# Patient Record
Sex: Male | Born: 1955 | Race: White | Hispanic: No | Marital: Single | Smoking: Current every day smoker
Health system: Southern US, Community
[De-identification: ages and names within clinical notes are randomized; demographics above are authoritative.]

## PROBLEM LIST (undated history)

## (undated) DIAGNOSIS — K9041 Non-celiac gluten sensitivity: Secondary | ICD-10-CM

## (undated) DIAGNOSIS — C801 Malignant (primary) neoplasm, unspecified: Secondary | ICD-10-CM

## (undated) DIAGNOSIS — Z789 Other specified health status: Secondary | ICD-10-CM

## (undated) HISTORY — PX: FOREIGN BODY REMOVAL: SHX962

---

## 2013-05-27 HISTORY — PX: MOHS SURGERY: SHX181

## 2013-06-17 ENCOUNTER — Encounter (HOSPITAL_BASED_OUTPATIENT_CLINIC_OR_DEPARTMENT_OTHER): Payer: Self-pay | Admitting: *Deleted

## 2013-06-17 NOTE — Progress Notes (Signed)
Pt has never had general an-only mac-no cardiac or resp problems-no labs needed

## 2013-06-23 ENCOUNTER — Other Ambulatory Visit: Payer: Self-pay | Admitting: Plastic Surgery

## 2013-06-23 ENCOUNTER — Encounter (HOSPITAL_BASED_OUTPATIENT_CLINIC_OR_DEPARTMENT_OTHER): Payer: Self-pay | Admitting: Anesthesiology

## 2013-06-23 ENCOUNTER — Ambulatory Visit (HOSPITAL_BASED_OUTPATIENT_CLINIC_OR_DEPARTMENT_OTHER): Payer: BC Managed Care – PPO | Admitting: Anesthesiology

## 2013-06-23 ENCOUNTER — Encounter (HOSPITAL_BASED_OUTPATIENT_CLINIC_OR_DEPARTMENT_OTHER): Payer: Self-pay | Admitting: *Deleted

## 2013-06-23 ENCOUNTER — Ambulatory Visit (HOSPITAL_BASED_OUTPATIENT_CLINIC_OR_DEPARTMENT_OTHER)
Admission: RE | Admit: 2013-06-23 | Discharge: 2013-06-23 | Disposition: A | Discharge status: Home / Self Care | Payer: BC Managed Care – PPO | Source: Ambulatory Visit | Attending: Plastic Surgery | Admitting: Plastic Surgery

## 2013-06-23 ENCOUNTER — Encounter (HOSPITAL_BASED_OUTPATIENT_CLINIC_OR_DEPARTMENT_OTHER)
Admission: RE | Disposition: A | Discharge status: Home / Self Care | Payer: Self-pay | Source: Ambulatory Visit | Attending: Plastic Surgery

## 2013-06-23 DIAGNOSIS — C44211 Basal cell carcinoma of skin of unspecified ear and external auricular canal: Secondary | ICD-10-CM | POA: Insufficient documentation

## 2013-06-23 DIAGNOSIS — Z538 Procedure and treatment not carried out for other reasons: Secondary | ICD-10-CM | POA: Insufficient documentation

## 2013-06-23 DIAGNOSIS — H61111 Acquired deformity of pinna, right ear: Secondary | ICD-10-CM

## 2013-06-23 DIAGNOSIS — F17200 Nicotine dependence, unspecified, uncomplicated: Secondary | ICD-10-CM | POA: Insufficient documentation

## 2013-06-23 HISTORY — DX: Non-celiac gluten sensitivity: K90.41

## 2013-06-23 LAB — POCT HEMOGLOBIN-HEMACUE: Hemoglobin: 12.7 g/dL — ABNORMAL LOW (ref 13.0–17.0)

## 2013-06-23 SURGERY — CANCELLED PROCEDURE
Anesthesia: General

## 2013-06-23 MED ORDER — LACTATED RINGERS IV SOLN
INTRAVENOUS | Status: DC
  Administered 2013-06-23: 12:00:00 via INTRAVENOUS

## 2013-06-23 MED ORDER — CIPROFLOXACIN IN D5W 400 MG/200ML IV SOLN: 400.0000 mg | INTRAVENOUS | Status: DC

## 2013-06-23 MED ORDER — MIDAZOLAM HCL 2 MG/2ML IJ SOLN: 1.0000 mg | INTRAMUSCULAR | Status: DC | PRN

## 2013-06-23 MED ORDER — FENTANYL CITRATE 0.05 MG/ML IJ SOLN: 50.0000 ug | INTRAMUSCULAR | Status: DC | PRN

## 2013-06-23 SURGICAL SUPPLY — 58 items
ADH SKN CLS APL DERMABOND .7 (GAUZE/BANDAGES/DRESSINGS)
BALL CTTN LRG ABS STRL LF (GAUZE/BANDAGES/DRESSINGS)
BANDAGE CONFORM 2  STR LF (GAUZE/BANDAGES/DRESSINGS) IMPLANT
BLADE HEX COATED 2.75 (ELECTRODE) IMPLANT
BLADE SURG 15 STRL LF DISP TIS (BLADE) ×1 IMPLANT
BLADE SURG 15 STRL SS (BLADE)
BLADE SURG ROTATE 9660 (MISCELLANEOUS) IMPLANT
BNDG CMPR MD 5X2 ELC HKLP STRL (GAUZE/BANDAGES/DRESSINGS)
BNDG ELASTIC 2 VLCR STRL LF (GAUZE/BANDAGES/DRESSINGS) IMPLANT
CANISTER SUCTION 1200CC (MISCELLANEOUS) IMPLANT
CLOTH BEACON ORANGE TIMEOUT ST (SAFETY) ×1 IMPLANT
CORDS BIPOLAR (ELECTRODE) IMPLANT
COTTONBALL LRG STERILE PKG (GAUZE/BANDAGES/DRESSINGS) IMPLANT
COVER MAYO STAND STRL (DRAPES) ×1 IMPLANT
COVER TABLE BACK 60X90 (DRAPES) ×1 IMPLANT
DERMABOND ADVANCED (GAUZE/BANDAGES/DRESSINGS)
DERMABOND ADVANCED .7 DNX12 (GAUZE/BANDAGES/DRESSINGS) IMPLANT
DRAPE U-SHAPE 76X120 STRL (DRAPES) IMPLANT
ELECT NDL BLADE 2-5/6 (NEEDLE) ×1 IMPLANT
ELECT NEEDLE BLADE 2-5/6 (NEEDLE) IMPLANT
ELECT REM PT RETURN 9FT ADLT (ELECTROSURGICAL)
ELECT REM PT RETURN 9FT PED (ELECTROSURGICAL)
ELECTRODE REM PT RETRN 9FT PED (ELECTROSURGICAL) IMPLANT
ELECTRODE REM PT RTRN 9FT ADLT (ELECTROSURGICAL) IMPLANT
GAUZE SPONGE 4X4 12PLY STRL LF (GAUZE/BANDAGES/DRESSINGS) IMPLANT
GAUZE XEROFORM 1X8 LF (GAUZE/BANDAGES/DRESSINGS) IMPLANT
GAUZE XEROFORM 5X9 LF (GAUZE/BANDAGES/DRESSINGS) IMPLANT
GLOVE BIO SURGEON STRL SZ 6.5 (GLOVE) ×1 IMPLANT
GLOVE SURG SS PI 7.0 STRL IVOR (GLOVE) IMPLANT
GOWN PREVENTION PLUS XLARGE (GOWN DISPOSABLE) ×2 IMPLANT
NDL HYPO 30GX1 BEV (NEEDLE) IMPLANT
NEEDLE 27GAX1X1/2 (NEEDLE) ×1 IMPLANT
NEEDLE HYPO 30GX1 BEV (NEEDLE) IMPLANT
NS IRRIG 1000ML POUR BTL (IV SOLUTION) IMPLANT
PACK BASIN DAY SURGERY FS (CUSTOM PROCEDURE TRAY) ×1 IMPLANT
PENCIL BUTTON HOLSTER BLD 10FT (ELECTRODE) IMPLANT
SHEET MEDIUM DRAPE 40X70 STRL (DRAPES) IMPLANT
SPONGE GAUZE 2X2 8PLY STRL LF (GAUZE/BANDAGES/DRESSINGS) IMPLANT
SPONGE LAP 18X18 X RAY DECT (DISPOSABLE) IMPLANT
SUCTION FRAZIER TIP 10 FR DISP (SUCTIONS) IMPLANT
SUT CHROMIC 4 0 P 3 18 (SUTURE) IMPLANT
SUT ETHILON 4 0 PS 2 18 (SUTURE) IMPLANT
SUT ETHILON 5 0 P 3 18 (SUTURE)
SUT MNCRL 6-0 UNDY P1 1X18 (SUTURE) ×1 IMPLANT
SUT MNCRL AB 4-0 PS2 18 (SUTURE) IMPLANT
SUT MON AB 5-0 P3 18 (SUTURE) IMPLANT
SUT MONOCRYL 6-0 P1 1X18 (SUTURE)
SUT NYLON ETHILON 5-0 P-3 1X18 (SUTURE) IMPLANT
SUT PLAIN 5 0 P 3 18 (SUTURE) IMPLANT
SUT PROLENE 5 0 P 3 (SUTURE) IMPLANT
SUT PROLENE 6 0 P 1 18 (SUTURE) IMPLANT
SUT VIC AB 5-0 P-3 18X BRD (SUTURE) IMPLANT
SUT VIC AB 5-0 P3 18 (SUTURE)
SUT VICRYL 4-0 PS2 18IN ABS (SUTURE) IMPLANT
SYR BULB 3OZ (MISCELLANEOUS) IMPLANT
SYR CONTROL 10ML LL (SYRINGE) ×1 IMPLANT
TRAY DSU PREP LF (CUSTOM PROCEDURE TRAY) ×1 IMPLANT
TUBE CONNECTING 20X1/4 (TUBING) IMPLANT

## 2013-06-23 NOTE — Anesthesia Preprocedure Evaluation (Signed)
Anesthesia Evaluation  Patient identified by MRN, date of birth, ID band Patient awake    Reviewed: Allergy & Precautions, H&P , NPO status , Patient's Chart, lab work & pertinent test results  Airway Mallampati: I TM Distance: >3 FB Neck ROM: Full    Dental  (+) Teeth Intact and Dental Advisory Given   Pulmonary  breath sounds clear to auscultation        Cardiovascular Rhythm:Regular Rate:Normal     Neuro/Psych    GI/Hepatic   Endo/Other    Renal/GU      Musculoskeletal   Abdominal   Peds  Hematology   Anesthesia Other Findings   Reproductive/Obstetrics                           Anesthesia Physical Anesthesia Plan  ASA: I  Anesthesia Plan: General   Post-op Pain Management:    Induction: Intravenous  Airway Management Planned: LMA  Additional Equipment:   Intra-op Plan:   Post-operative Plan: Extubation in OR  Informed Consent: I have reviewed the patients History and Physical, chart, labs and discussed the procedure including the risks, benefits and alternatives for the proposed anesthesia with the patient or authorized representative who has indicated his/her understanding and acceptance.   Dental advisory given  Plan Discussed with: CRNA and Anesthesiologist  Anesthesia Plan Comments:         Anesthesia Quick Evaluation

## 2013-06-23 NOTE — H&P (Signed)
This document contains confidential information from a [facility] medical record system and may be unauthenticated. Release may be made only with a valid authorization or in accordance with applicable policies of Medical Center or its affiliates. This document must be maintained in a secure manner or discarded/destroyed as required by Medical Center policy or by a confidential means such as shredding.     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 male.  HPI The patient is a 57 yrs old male here for evaluation of a right ear defect. He noticed a growing lesion on the right ear which was biopsied and found to be a basal cell carcinoma. He had the excision done last week via mohs with negative margins obtained.  He is otherwise in good health and does not have any other issues.  He is a half pack a day smoker. The defect involved the entire concha and a portion of the middle of the antihelix with a cartilage gone as well. He has a normal left ear.  The following portions of the patient's history were reviewed and updated as appropriate: allergies, current medications, past family history, past medical history, past social history, past surgical history and problem list.  Review of Systems  Constitutional: Negative.   HENT: Negative.   Eyes: Negative.   Respiratory: Negative.   Cardiovascular: Negative.   Gastrointestinal: Negative.   Endocrine: Negative.   Genitourinary: Negative.   Hematological: Negative.   Psychiatric/Behavioral: Negative.       Objective:    Physical Exam  Constitutional: He appears well-developed and well-nourished.  HENT:   Head: Normocephalic.  Left Ear: External ear normal.  Ears: Eyes: Conjunctivae and EOM are normal. Pupils are equal, round, and reactive to light.  Cardiovascular: Normal rate.   Pulmonary/Chest: Effort normal.  Neurological: He is alert.  Skin: Skin is warm.  Psychiatric: He has a normal mood and affect. His behavior is normal. Judgment and thought content normal.      Assessment:    1.  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

## 2013-06-23 NOTE — Progress Notes (Signed)
All valuables returned to patient

## 2013-06-23 NOTE — H&P (View-Only) (Signed)
This document contains confidential information from a [facility] medical record system and may be unauthenticated. Release may be made only with a valid authorization or in accordance with applicable policies of Medical Center or its affiliates. This document must be maintained in a secure manner or discarded/destroyed as required by Medical Center policy or by a confidential means such as shredding.     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 male.  HPI The patient is a 57 yrs old male here for evaluation of a right ear defect. He noticed a growing lesion on the right ear which was biopsied and found to be a basal cell carcinoma. He had the excision done last week via mohs with negative margins obtained.  He is otherwise in good health and does not have any other issues.  He is a half pack a day smoker. The defect involved the entire concha and a portion of the middle of the antihelix with a cartilage gone as well. He has a normal left ear.  The following portions of the patient's history were reviewed and updated as appropriate: allergies, current medications, past family history, past medical history, past social history, past surgical history and problem list.  Review of Systems  Constitutional: Negative.   HENT: Negative.   Eyes: Negative.   Respiratory: Negative.   Cardiovascular: Negative.   Gastrointestinal: Negative.   Endocrine: Negative.   Genitourinary: Negative.   Hematological: Negative.   Psychiatric/Behavioral: Negative.       Objective:    Physical Exam  Constitutional: He appears well-developed and well-nourished.  HENT:   Head: Normocephalic.  Left Ear: External ear normal.  Ears: Eyes: Conjunctivae and EOM are normal. Pupils are equal, round, and reactive to light.  Cardiovascular: Normal rate.   Pulmonary/Chest: Effort normal.  Neurological: He is alert.  Skin: Skin is warm.  Psychiatric: He has a normal mood and affect. His behavior is normal. Judgment and thought content normal.      Assessment:    1.  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          

## 2013-06-23 NOTE — Interval H&P Note (Signed)
History and Physical Interval Note:  06/23/2013 12:45 PM  Caleb Durham  has presented today for surgery, with the diagnosis of BASAL CELL CARCINOMA OF AURICLE OF RIGHT EAR  The various methods of treatment have been discussed with the patient and family. After consideration of risks, benefits and other options for treatment, the patient has consented to  Procedure(s) with comments: RIGHT EAR FULL THICKNESS SKIN GRAFT WITH POSSIBLE CARTLAGE GRAFT (Right) - case cancelled due to patient ear healing per Dr. Rhona Leavens as a surgical intervention .  The patient's history has been reviewed, patient examined, no change in status, stable for surgery.  I have reviewed the patient's chart and labs.  Questions were answered to the patient's satisfaction.    The surgery will be postponed due to changes and partial healing.  SANGER,Judithe Keetch

## 2013-06-23 NOTE — Progress Notes (Signed)
Case cancelled by Dr Kelly Splinter. Patient to go to her office today. He is aware.

## 2013-11-10 ENCOUNTER — Encounter (HOSPITAL_BASED_OUTPATIENT_CLINIC_OR_DEPARTMENT_OTHER): Payer: Self-pay | Admitting: *Deleted

## 2013-11-10 NOTE — Progress Notes (Signed)
No labs needed

## 2013-11-11 ENCOUNTER — Encounter (HOSPITAL_BASED_OUTPATIENT_CLINIC_OR_DEPARTMENT_OTHER)
Admission: RE | Disposition: A | Discharge status: Home / Self Care | Payer: Self-pay | Source: Ambulatory Visit | Attending: Plastic Surgery

## 2013-11-11 ENCOUNTER — Other Ambulatory Visit: Payer: Self-pay | Admitting: Plastic Surgery

## 2013-11-11 ENCOUNTER — Encounter (HOSPITAL_BASED_OUTPATIENT_CLINIC_OR_DEPARTMENT_OTHER): Payer: Self-pay | Admitting: Plastic Surgery

## 2013-11-11 ENCOUNTER — Ambulatory Visit (HOSPITAL_BASED_OUTPATIENT_CLINIC_OR_DEPARTMENT_OTHER): Payer: BC Managed Care – PPO | Admitting: Anesthesiology

## 2013-11-11 ENCOUNTER — Ambulatory Visit (HOSPITAL_BASED_OUTPATIENT_CLINIC_OR_DEPARTMENT_OTHER)
Admission: RE | Admit: 2013-11-11 | Discharge: 2013-11-11 | Disposition: A | Discharge status: Home / Self Care | Payer: BC Managed Care – PPO | Source: Ambulatory Visit | Attending: Plastic Surgery | Admitting: Plastic Surgery

## 2013-11-11 ENCOUNTER — Encounter (HOSPITAL_BASED_OUTPATIENT_CLINIC_OR_DEPARTMENT_OTHER): Payer: BC Managed Care – PPO | Admitting: Anesthesiology

## 2013-11-11 DIAGNOSIS — H61111 Acquired deformity of pinna, right ear: Secondary | ICD-10-CM

## 2013-11-11 DIAGNOSIS — Z85820 Personal history of malignant melanoma of skin: Secondary | ICD-10-CM | POA: Insufficient documentation

## 2013-11-11 DIAGNOSIS — Z428 Encounter for other plastic and reconstructive surgery following medical procedure or healed injury: Secondary | ICD-10-CM | POA: Insufficient documentation

## 2013-11-11 HISTORY — PX: OTOPLASATY: SHX1485

## 2013-11-11 HISTORY — DX: Other specified health status: Z78.9

## 2013-11-11 SURGERY — RECONSTRUCTION, EAR
Anesthesia: General | Site: Ear | Laterality: Right

## 2013-11-11 MED ORDER — LIDOCAINE HCL (CARDIAC) 20 MG/ML IV SOLN
INTRAVENOUS | Status: DC | PRN
  Administered 2013-11-11: 75 mg via INTRAVENOUS

## 2013-11-11 MED ORDER — PROPOFOL 10 MG/ML IV BOLUS
INTRAVENOUS | Status: DC | PRN
  Administered 2013-11-11: 300 mg via INTRAVENOUS

## 2013-11-11 MED ORDER — SUCCINYLCHOLINE CHLORIDE 20 MG/ML IJ SOLN
INTRAMUSCULAR | Status: AC
  Filled 2013-11-11: qty 1

## 2013-11-11 MED ORDER — ONDANSETRON HCL 4 MG/2ML IJ SOLN
INTRAMUSCULAR | Status: DC | PRN
  Administered 2013-11-11: 4 mg via INTRAVENOUS

## 2013-11-11 MED ORDER — FENTANYL CITRATE 0.05 MG/ML IJ SOLN: 50.0000 ug | INTRAMUSCULAR | Status: DC | PRN

## 2013-11-11 MED ORDER — OXYCODONE HCL 5 MG/5ML PO SOLN: 5.0000 mg | Freq: Once | ORAL | Status: DC | PRN

## 2013-11-11 MED ORDER — MIDAZOLAM HCL 2 MG/2ML IJ SOLN
INTRAMUSCULAR | Status: AC
  Filled 2013-11-11: qty 2

## 2013-11-11 MED ORDER — OXYCODONE HCL 5 MG PO TABS: 5.0000 mg | ORAL_TABLET | Freq: Once | ORAL | Status: DC | PRN

## 2013-11-11 MED ORDER — HYDROMORPHONE HCL PF 1 MG/ML IJ SOLN: 0.2500 mg | INTRAMUSCULAR | Status: DC | PRN

## 2013-11-11 MED ORDER — CIPROFLOXACIN IN D5W 400 MG/200ML IV SOLN
400.0000 mg | INTRAVENOUS | Status: AC
  Administered 2013-11-11 (×2): 400 mg via INTRAVENOUS

## 2013-11-11 MED ORDER — DEXAMETHASONE SODIUM PHOSPHATE 4 MG/ML IJ SOLN
INTRAMUSCULAR | Status: DC | PRN
  Administered 2013-11-11: 10 mg via INTRAVENOUS

## 2013-11-11 MED ORDER — MIDAZOLAM HCL 5 MG/5ML IJ SOLN
INTRAMUSCULAR | Status: DC | PRN
  Administered 2013-11-11: 2 mg via INTRAVENOUS

## 2013-11-11 MED ORDER — LIDOCAINE-EPINEPHRINE 1 %-1:100000 IJ SOLN
INTRAMUSCULAR | Status: DC | PRN
  Administered 2013-11-11: 1 mL

## 2013-11-11 MED ORDER — LACTATED RINGERS IV SOLN
INTRAVENOUS | Status: DC
  Administered 2013-11-11 (×2): via INTRAVENOUS

## 2013-11-11 MED ORDER — SUCCINYLCHOLINE CHLORIDE 20 MG/ML IJ SOLN
INTRAMUSCULAR | Status: DC | PRN
  Administered 2013-11-11: 100 mg via INTRAVENOUS

## 2013-11-11 MED ORDER — METOCLOPRAMIDE HCL 5 MG/ML IJ SOLN: 10.0000 mg | Freq: Once | INTRAMUSCULAR | Status: DC | PRN

## 2013-11-11 MED ORDER — CIPROFLOXACIN IN D5W 400 MG/200ML IV SOLN
INTRAVENOUS | Status: AC
  Filled 2013-11-11: qty 200

## 2013-11-11 MED ORDER — FENTANYL CITRATE 0.05 MG/ML IJ SOLN
INTRAMUSCULAR | Status: AC
  Filled 2013-11-11: qty 6

## 2013-11-11 MED ORDER — EPHEDRINE SULFATE 50 MG/ML IJ SOLN
INTRAMUSCULAR | Status: DC | PRN
  Administered 2013-11-11: 15 mg via INTRAVENOUS
  Administered 2013-11-11: 10 mg via INTRAVENOUS

## 2013-11-11 MED ORDER — FENTANYL CITRATE 0.05 MG/ML IJ SOLN
INTRAMUSCULAR | Status: DC | PRN
  Administered 2013-11-11: 25 ug via INTRAVENOUS
  Administered 2013-11-11 (×3): 50 ug via INTRAVENOUS

## 2013-11-11 MED ORDER — MIDAZOLAM HCL 2 MG/2ML IJ SOLN: 1.0000 mg | INTRAMUSCULAR | Status: DC | PRN

## 2013-11-11 SURGICAL SUPPLY — 40 items
ADH SKN CLS APL DERMABOND .7 (GAUZE/BANDAGES/DRESSINGS)
BLADE SURG 15 STRL LF DISP TIS (BLADE) ×1 IMPLANT
BLADE SURG 15 STRL SS (BLADE) ×2
CANISTER SUCT 1200ML W/VALVE (MISCELLANEOUS) ×2 IMPLANT
COVER MAYO STAND STRL (DRAPES) ×2 IMPLANT
COVER TABLE BACK 60X90 (DRAPES) ×2 IMPLANT
DECANTER SPIKE VIAL GLASS SM (MISCELLANEOUS) IMPLANT
DERMABOND ADVANCED (GAUZE/BANDAGES/DRESSINGS)
DERMABOND ADVANCED .7 DNX12 (GAUZE/BANDAGES/DRESSINGS) IMPLANT
DRAPE U-SHAPE 76X120 STRL (DRAPES) ×2 IMPLANT
ELECT COATED BLADE 2.86 ST (ELECTRODE) IMPLANT
ELECT NDL BLADE 2-5/6 (NEEDLE) ×1 IMPLANT
ELECT NEEDLE BLADE 2-5/6 (NEEDLE) ×2 IMPLANT
ELECT REM PT RETURN 9FT ADLT (ELECTROSURGICAL) ×2
ELECTRODE REM PT RTRN 9FT ADLT (ELECTROSURGICAL) ×1 IMPLANT
GAUZE XEROFORM 1X8 LF (GAUZE/BANDAGES/DRESSINGS) IMPLANT
GAUZE XEROFORM 5X9 LF (GAUZE/BANDAGES/DRESSINGS) IMPLANT
GLOVE BIO SURGEON STRL SZ 6.5 (GLOVE) ×4 IMPLANT
GLOVE BIOGEL PI IND STRL 6.5 (GLOVE) IMPLANT
GLOVE BIOGEL PI INDICATOR 6.5 (GLOVE) ×1
GLOVE ECLIPSE 6.5 STRL STRAW (GLOVE) ×1 IMPLANT
GOWN PREVENTION PLUS XLARGE (GOWN DISPOSABLE) ×4 IMPLANT
NEEDLE 27GAX1X1/2 (NEEDLE) ×2 IMPLANT
PACK BASIN DAY SURGERY FS (CUSTOM PROCEDURE TRAY) ×2 IMPLANT
PENCIL BUTTON HOLSTER BLD 10FT (ELECTRODE) ×2 IMPLANT
SPONGE GAUZE 4X4 12PLY STER LF (GAUZE/BANDAGES/DRESSINGS) IMPLANT
STRIP CLOSURE SKIN 1/2X4 (GAUZE/BANDAGES/DRESSINGS) IMPLANT
SUCTION FRAZIER TIP 10 FR DISP (SUCTIONS) ×2 IMPLANT
SUT MNCRL 6-0 UNDY P1 1X18 (SUTURE) IMPLANT
SUT MON AB 5-0 P3 18 (SUTURE) IMPLANT
SUT MON AB 5-0 PS2 18 (SUTURE) IMPLANT
SUT MONOCRYL 6-0 P1 1X18 (SUTURE) ×1
SUT VIC AB 5-0 P-3 18X BRD (SUTURE) IMPLANT
SUT VIC AB 5-0 P3 18 (SUTURE)
SUT VIC AB 5-0 PS2 18 (SUTURE) IMPLANT
SUT VICRYL 6 0 P 1 18 (SUTURE) IMPLANT
SYR CONTROL 10ML LL (SYRINGE) ×2 IMPLANT
TOWEL OR 17X24 6PK STRL BLUE (TOWEL DISPOSABLE) ×2 IMPLANT
TRAY DSU PREP LF (CUSTOM PROCEDURE TRAY) ×2 IMPLANT
TUBE CONNECTING 20X1/4 (TUBING) ×2 IMPLANT

## 2013-11-11 NOTE — Anesthesia Procedure Notes (Signed)
Procedure Name: Intubation Date/Time: 11/11/2013 8:57 AM Performed by: Zenia Resides D Pre-anesthesia Checklist: Patient identified, Emergency Drugs available, Suction available and Patient being monitored Patient Re-evaluated:Patient Re-evaluated prior to inductionOxygen Delivery Method: Circle System Utilized Preoxygenation: Pre-oxygenation with 100% oxygen Intubation Type: IV induction Ventilation: Mask ventilation without difficulty Laryngoscope Size: Mac and 3 Grade View: Grade II Tube type: Oral Number of attempts: 1 Airway Equipment and Method: stylet and oral airway Placement Confirmation: ETT inserted through vocal cords under direct vision,  positive ETCO2 and breath sounds checked- equal and bilateral Secured at: 24 cm Tube secured with: Tape Dental Injury: Teeth and Oropharynx as per pre-operative assessment

## 2013-11-11 NOTE — Transfer of Care (Signed)
Immediate Anesthesia Transfer of Care Note  Patient: Caleb Durham  Procedure(s) Performed: Procedure(s): RECONSTRUCTION RIGHT EAR WITH POSSIBLE CARTILAGE GRAFT FROM LEFT EAR TO RIGHT EAR POSSIBLE TISSUE REARRANGEMENT AND ADVANCEMENT (Right)  Patient Location: PACU  Anesthesia Type:General  Level of Consciousness: awake, alert  and oriented  Airway & Oxygen Therapy: Patient Spontanous Breathing and Patient connected to face mask oxygen  Post-op Assessment: Report given to PACU RN and Post -op Vital signs reviewed and stable  Post vital signs: Reviewed and stable  Complications: No apparent anesthesia complications

## 2013-11-11 NOTE — Interval H&P Note (Signed)
History and Physical Interval Note:  11/11/2013 8:26 AM  Caleb Durham  has presented today for surgery, with the diagnosis of mohs defect of right ear  The various methods of treatment have been discussed with the patient and family. After consideration of risks, benefits and other options for treatment, the patient has consented to  Procedure(s): RECONSTRUCTION RIGHT EAR WITH POSSIBLE CARTILAGE GRAFT FROM LEFT EAR TO RIGHT EAR POSSIBLE TISSUE REARRANGEMENT AND ADVANCEMENT (Right) as a surgical intervention .  The patient's history has been reviewed, patient examined, no change in status, stable for surgery.  I have reviewed the patient's chart and labs.  Questions were answered to the patient's satisfaction.     SANGER,Sheryl Towell

## 2013-11-11 NOTE — H&P (Signed)
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 male.  HPI The patient is a 57 yrs old wm here history and physical for revision on his right ear defect after Mohs surgery for excision of a basal cell.  He has completely healed the area.  The ear is scaring tight and low.  No sign of skin breakdown or recurrence. He desires a minimally invasive procedure to improve the contour of the right ear defect.   The following portions of the patient's history were reviewed and updated as appropriate: allergies, current medications, past family history, past medical history, past social history, past surgical history and problem list.  Review of Systems  All other systems reviewed and are negative.     Objective:    Physical Exam  Constitutional: He is oriented to person, place, and time. He appears well-developed and well-nourished. No distress.  Eyes: Conjunctivae and EOM are normal. Pupils are equal, round, and reactive to light.  Neck: Normal range of motion. Neck supple. No tracheal deviation present. No thyromegaly present.  Cardiovascular: Normal rate, regular rhythm and intact distal pulses.   Pulmonary/Chest: Effort normal. No stridor. No respiratory distress.  Neurological: He is alert and oriented to person, place, and time. No cranial nerve deficit. He exhibits normal muscle tone. Coordination normal.  Skin: Skin is warm and dry. No rash noted. No erythema.  Psychiatric: He has a normal mood and affect. His behavior is normal. Judgment and thought content normal.     Assessment:      1.  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 The procedure, possible risks, benefits and complications were discussed with the patient and he desires to proceed and consent was obtained.       Medications Ordered This Encounter    HYDROcodone-acetaminophen (NORCO) 5-325 mg per tablet  Take 1 tablet by mouth every 6 (six) hours as needed for up to 10 days for Pain.   doxycycline (VIBRAMYCIN) 50 MG capsule  Take 2 capsules (100 mg total) by mouth 2 times daily.

## 2013-11-11 NOTE — Anesthesia Postprocedure Evaluation (Signed)
Anesthesia Post Note  Patient: Caleb Durham  Procedure(s) Performed: Procedure(s) (LRB): RECONSTRUCTION RIGHT EAR WITH POSSIBLE CARTILAGE GRAFT FROM LEFT EAR TO RIGHT EAR POSSIBLE TISSUE REARRANGEMENT AND ADVANCEMENT (Right)  Anesthesia type: General  Patient location: PACU  Post pain: Pain level controlled  Post assessment: Patient's Cardiovascular Status Stable  Last Vitals:  Filed Vitals:   11/11/13 1030  BP: 125/76  Pulse: 57  Temp:   Resp: 18    Post vital signs: Reviewed and stable  Level of consciousness: alert  Complications: No apparent anesthesia complications

## 2013-11-11 NOTE — Anesthesia Preprocedure Evaluation (Signed)
Anesthesia Evaluation  Patient identified by MRN, date of birth, ID band Patient awake    Reviewed: Allergy & Precautions, H&P , NPO status , Patient's Chart, lab work & pertinent test results, reviewed documented beta blocker date and time   Airway Mallampati: II TM Distance: >3 FB Neck ROM: full    Dental   Pulmonary neg pulmonary ROS, Current Smoker,  breath sounds clear to auscultation        Cardiovascular negative cardio ROS  Rhythm:regular     Neuro/Psych negative neurological ROS  negative psych ROS   GI/Hepatic negative GI ROS, Neg liver ROS,   Endo/Other  negative endocrine ROS  Renal/GU negative Renal ROS  negative genitourinary   Musculoskeletal   Abdominal   Peds  Hematology negative hematology ROS (+)   Anesthesia Other Findings See surgeon's H&P   Reproductive/Obstetrics negative OB ROS                           Anesthesia Physical Anesthesia Plan  ASA: I  Anesthesia Plan: General   Post-op Pain Management:    Induction: Intravenous  Airway Management Planned: Oral ETT  Additional Equipment:   Intra-op Plan:   Post-operative Plan:   Informed Consent: I have reviewed the patients History and Physical, chart, labs and discussed the procedure including the risks, benefits and alternatives for the proposed anesthesia with the patient or authorized representative who has indicated his/her understanding and acceptance.   Dental Advisory Given  Plan Discussed with: CRNA and Surgeon  Anesthesia Plan Comments:         Anesthesia Quick Evaluation

## 2013-11-11 NOTE — Op Note (Signed)
Operative Note   DATE OF OPERATION: 11/11/2013  LOCATION: [facility]  SURGICAL DIVISION: Plastic Surgery  PREOPERATIVE DIAGNOSES: Right ear defect after mohs excision of skin cancer  POSTOPERATIVE DIAGNOSES:  same  PROCEDURE:  Reconstruction of right ear with cartilage advancement 1 cm  SURGEON: Wayland Denis, DO  ASSISTANT: Shawn Rayburn, PA  ANESTHESIA:  General.   COMPLICATIONS: None.   INDICATIONS FOR PROCEDURE:  The patient, Caleb Durham, is a 57 y.o. male born on October 03, 1956, is here for treatment of ear defect.   CONSENT:  Informed consent was obtained directly from the patient. Risks, benefits and alternatives were fully discussed. Specific risks including but not limited to bleeding, infection, hematoma, seroma, scarring, pain, implant infection, implant extrusion, capsular contracture, asymmetry, wound healing problems, and need for further surgery were all discussed. The patient did have an ample opportunity to have questions answered to satisfaction.   DESCRIPTION OF PROCEDURE:  The patient was taken to the operating room. SCDs were placed and IV antibiotics were given. The patient's operative site was prepped and draped in a sterile fashion. A time out was performed and all information was confirmed to be correct.  General anesthesia was administered.  The ear was prepped and the area to be excised was marked with a marking pen.  Local was injected for intraoperative hemostasis and postoperative pain control.  The skin at the base of the helix was excised the cartilage was retained and advanced with the inferior portion overlapping the superior portion for stability.  The cartilage was secured to itself with 5-0 Monocryl.  The skin was then closed with 6-0 running Monocryl.  The patient tolerated the procedure well. There were no complications.  Triple antibiotic ointment and gauze were applied. The patient was allowed to wake from anesthesia, extubated and  taken to the recovery room in satisfactory condition.

## 2013-11-11 NOTE — Brief Op Note (Signed)
11/11/2013  9:41 AM  PATIENT:  Wiliam Ke  57 y.o. male  PRE-OPERATIVE DIAGNOSIS:  mohs defect of right ear  POST-OPERATIVE DIAGNOSIS:  mohs defect of right ear  PROCEDURE:  Procedure(s): RECONSTRUCTION RIGHT EAR WITH POSSIBLE CARTILAGE GRAFT FROM LEFT EAR TO RIGHT EAR POSSIBLE TISSUE REARRANGEMENT AND ADVANCEMENT (Right)  SURGEON:  Surgeon(s) and Role:    * Claire Sanger, DO - Primary  PHYSICIAN ASSISTANT: Shawn Rayburn, PA  ASSISTANTS: none   ANESTHESIA:   general  EBL:     BLOOD ADMINISTERED:none  DRAINS: none   LOCAL MEDICATIONS USED:  LIDOCAINE   SPECIMEN:  No Specimen  DISPOSITION OF SPECIMEN:  N/A  COUNTS:  YES  TOURNIQUET:  * No tourniquets in log *  DICTATION: .Dragon Dictation  PLAN OF CARE: Discharge to home after PACU  PATIENT DISPOSITION:  PACU - hemodynamically stable.   Delay start of Pharmacological VTE agent (>24hrs) due to surgical blood loss or risk of bleeding: no

## 2013-11-12 ENCOUNTER — Encounter (HOSPITAL_BASED_OUTPATIENT_CLINIC_OR_DEPARTMENT_OTHER): Payer: Self-pay | Admitting: Plastic Surgery

## 2013-12-22 ENCOUNTER — Other Ambulatory Visit (HOSPITAL_COMMUNITY): Payer: Self-pay | Admitting: General Surgery

## 2013-12-22 DIAGNOSIS — R52 Pain, unspecified: Secondary | ICD-10-CM

## 2013-12-23 ENCOUNTER — Other Ambulatory Visit (HOSPITAL_COMMUNITY): Payer: Self-pay | Admitting: General Surgery

## 2013-12-23 ENCOUNTER — Ambulatory Visit (HOSPITAL_COMMUNITY)
Admission: RE | Admit: 2013-12-23 | Discharge: 2013-12-23 | Disposition: A | Discharge status: Home / Self Care | Payer: BC Managed Care – PPO | Source: Ambulatory Visit | Attending: General Surgery | Admitting: General Surgery

## 2013-12-23 DIAGNOSIS — M79609 Pain in unspecified limb: Secondary | ICD-10-CM | POA: Insufficient documentation

## 2013-12-23 DIAGNOSIS — R52 Pain, unspecified: Secondary | ICD-10-CM

## 2013-12-23 DIAGNOSIS — L539 Erythematous condition, unspecified: Secondary | ICD-10-CM | POA: Insufficient documentation

## 2013-12-23 DIAGNOSIS — I743 Embolism and thrombosis of arteries of the lower extremities: Secondary | ICD-10-CM | POA: Insufficient documentation

## 2013-12-24 ENCOUNTER — Other Ambulatory Visit: Payer: Self-pay

## 2013-12-24 ENCOUNTER — Encounter (HOSPITAL_COMMUNITY): Payer: Self-pay

## 2013-12-24 ENCOUNTER — Encounter (HOSPITAL_COMMUNITY)
Admission: RE | Admit: 2013-12-24 | Discharge: 2013-12-24 | Disposition: A | Discharge status: Home / Self Care | Payer: BC Managed Care – PPO | Source: Ambulatory Visit | Attending: General Surgery | Admitting: General Surgery

## 2013-12-24 DIAGNOSIS — Z01818 Encounter for other preprocedural examination: Secondary | ICD-10-CM | POA: Insufficient documentation

## 2013-12-24 DIAGNOSIS — Z01810 Encounter for preprocedural cardiovascular examination: Secondary | ICD-10-CM | POA: Insufficient documentation

## 2013-12-24 HISTORY — DX: Malignant (primary) neoplasm, unspecified: C80.1

## 2013-12-24 LAB — BASIC METABOLIC PANEL
BUN: 16 mg/dL (ref 6–23)
CHLORIDE: 102 meq/L (ref 96–112)
CO2: 32 meq/L (ref 19–32)
Calcium: 9.5 mg/dL (ref 8.4–10.5)
Creatinine, Ser: 0.99 mg/dL (ref 0.50–1.35)
GFR calc Af Amer: >90 mL/min (ref >90)
GFR calc non Af Amer: 90 mL/min — ABNORMAL LOW (ref >90)
GLUCOSE: 100 mg/dL — AB (ref 70–99)
POTASSIUM: 4.7 meq/L (ref 3.7–5.3)
SODIUM: 144 meq/L (ref 137–147)

## 2013-12-24 LAB — CBC WITH DIFFERENTIAL/PLATELET
Basophils Absolute: 0 10*3/uL (ref 0.0–0.1)
Basophils Relative: 0 % (ref 0–1)
EOS ABS: 0.1 10*3/uL (ref 0.0–0.7)
EOS PCT: 1 % (ref 0–5)
HEMATOCRIT: 45.3 % (ref 39.0–52.0)
HEMOGLOBIN: 14.9 g/dL (ref 13.0–17.0)
Lymphocytes Relative: 31 % (ref 12–46)
Lymphs Abs: 2.2 10*3/uL (ref 0.7–4.0)
MCH: 30.8 pg (ref 26.0–34.0)
MCHC: 32.9 g/dL (ref 30.0–36.0)
MCV: 93.6 fL (ref 78.0–100.0)
MONOS PCT: 5 % (ref 3–12)
Monocytes Absolute: 0.3 10*3/uL (ref 0.1–1.0)
NEUTROS ABS: 4.5 10*3/uL (ref 1.7–7.7)
Neutrophils Relative %: 64 % (ref 43–77)
Platelets: 182 10*3/uL (ref 150–400)
RBC: 4.84 MIL/uL (ref 4.22–5.81)
RDW: 13.5 % (ref 11.5–15.5)
WBC: 7.1 10*3/uL (ref 4.0–10.5)

## 2013-12-24 NOTE — Patient Instructions (Signed)
Caleb Durham  12/24/2013   Your procedure is scheduled on:   12/30/2013  Report to [facility] at  4  AM.  Call this number if you have problems the morning of surgery: [phone]   Remember:   Do not eat food or drink liquids after midnight.   Take these medicines the morning of surgery with A SIP OF WATER:  none   Do not wear jewelry, make-up or nail polish.  Do not wear lotions, powders, or perfumes.   Do not shave 48 hours prior to surgery. Men may shave face and neck.  Do not bring valuables to the hospital.  [facility] is not responsible for any belongings or valuables.               Contacts, dentures or bridgework may not be worn into surgery.  Leave suitcase in the car. After surgery it may be brought to your room.  For patients admitted to the hospital, discharge time is determined by your treatment team.               Patients discharged the day of surgery will not be allowed to drive home.  Name and phone number of your driver: family  Special Instructions: Shower using CHG 2 nights before surgery and the night before surgery.  If you shower the day of surgery use CHG.  Use special wash - you have one bottle of CHG for all showers.  You should use approximately 1/3 of the bottle for each shower.   Please read over the following fact sheets that you were given: Pain Booklet, Coughing and Deep Breathing, Surgical Site Infection Prevention, Anesthesia Post-op Instructions and Care and Recovery After Surgery Hernia A hernia occurs when an internal organ pushes out through a weak spot in the abdominal wall. Hernias most commonly occur in the groin and around the navel. Hernias often can be pushed back into place (reduced). Most hernias tend to get worse over time. Some abdominal hernias can get stuck in the opening (irreducible or incarcerated hernia) and cannot be reduced. An irreducible abdominal hernia which is tightly squeezed into the opening is at risk for  impaired blood supply (strangulated hernia). A strangulated hernia is a medical emergency. Because of the risk for an irreducible or strangulated hernia, surgery may be recommended to repair a hernia. CAUSES   Heavy lifting.  Prolonged coughing.  Straining to have a bowel movement.  A cut (incision) made during an abdominal surgery. HOME CARE INSTRUCTIONS   Bed rest is not required. You may continue your normal activities.  Avoid lifting more than 10 pounds (4.5 kg) or straining.  Cough gently. If you are a smoker it is best to stop. Even the best hernia repair can break down with the continual strain of coughing. Even if you do not have your hernia repaired, a cough will continue to aggravate the problem.  Do not wear anything tight over your hernia. Do not try to keep it in with an outside bandage or truss. These can damage abdominal contents if they are trapped within the hernia sac.  Eat a normal diet.  Avoid constipation. Straining over long periods of time will increase hernia size and encourage breakdown of repairs. If you cannot do this with diet alone, stool softeners may be used. SEEK IMMEDIATE MEDICAL CARE IF:   You have a fever.  You develop increasing abdominal pain.  You feel nauseous or vomit.  Your  hernia is stuck outside the abdomen, looks discolored, feels hard, or is tender.  You have any changes in your bowel habits or in the hernia that are unusual for you.  You have increased pain or swelling around the hernia.  You cannot push the hernia back in place by applying gentle pressure while lying down. MAKE SURE YOU:   Understand these instructions.  Will watch your condition.  Will get help right away if you are not doing well or get worse. Document Released: 11/11/2005 Document Revised: 02/03/2012 Document Reviewed: 06/30/2008 [facility] Patient Information 2014 [place]. PATIENT INSTRUCTIONS POST-ANESTHESIA  IMMEDIATELY FOLLOWING SURGERY:  Do  not drive or operate machinery for the first twenty four hours after surgery.  Do not make any important decisions for twenty four hours after surgery or while taking narcotic pain medications or sedatives.  If you develop intractable nausea and vomiting or a severe headache please notify your doctor immediately.  FOLLOW-UP:  Please make an appointment with your surgeon as instructed. You do not need to follow up with anesthesia unless specifically instructed to do so.  WOUND CARE INSTRUCTIONS (if applicable):  Keep a dry clean dressing on the anesthesia/puncture wound site if there is drainage.  Once the wound has quit draining you may leave it open to air.  Generally you should leave the bandage intact for twenty four hours unless there is drainage.  If the epidural site drains for more than 36-48 hours please call the anesthesia department.  QUESTIONS?:  Please feel free to call your physician or the hospital operator if you have any questions, and they will be happy to assist you.

## 2013-12-30 ENCOUNTER — Ambulatory Visit (HOSPITAL_COMMUNITY): Admission: RE | Admit: 2013-12-30 | Payer: BC Managed Care – PPO | Source: Ambulatory Visit | Admitting: General Surgery

## 2013-12-30 ENCOUNTER — Encounter (HOSPITAL_COMMUNITY): Admission: RE | Payer: Self-pay | Source: Ambulatory Visit

## 2013-12-30 SURGERY — REPAIR, HERNIA, INGUINAL, ADULT
Anesthesia: General | Laterality: Left

## 2014-12-08 ENCOUNTER — Encounter (HOSPITAL_BASED_OUTPATIENT_CLINIC_OR_DEPARTMENT_OTHER): Payer: Self-pay | Admitting: Plastic Surgery

## 2014-12-19 IMAGING — US US EXTREM LOW VENOUS*L*
1 series · 14 of 24 positions shown · non-contrast
Comparison: Duplex arterial study performed the same day

CLINICAL DATA: Left leg pain from knee to foot

EXAM:
Left LOWER EXTREMITY VENOUS DOPPLER ULTRASOUND
TECHNIQUE: Gray-scale sonography with graded compression, as well as color
Doppler and duplex ultrasound, were performed to evaluate the deep
venous system from the level of the common femoral vein through the
popliteal and proximal calf veins. Spectral Doppler was utilized to
evaluate flow at rest and with distal augmentation maneuvers.

[Series 1: us extrem low venous*left* · 0.08mm/px · 14 of 40 slices shown]
[im 1/40]
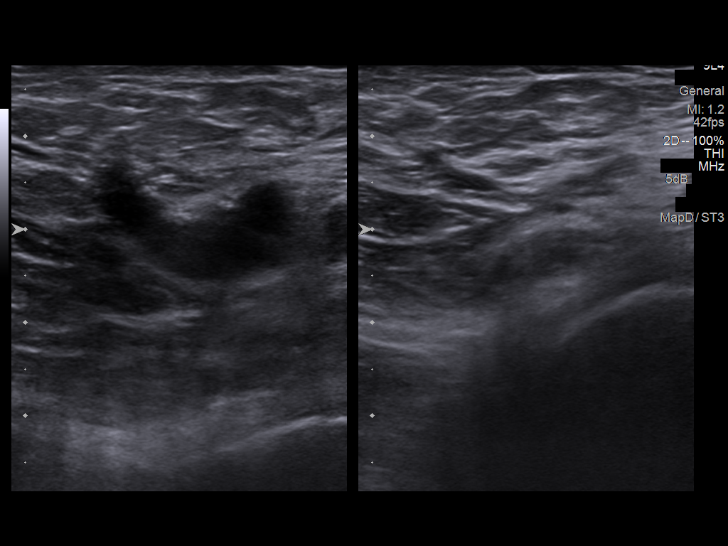
[im 4/40]
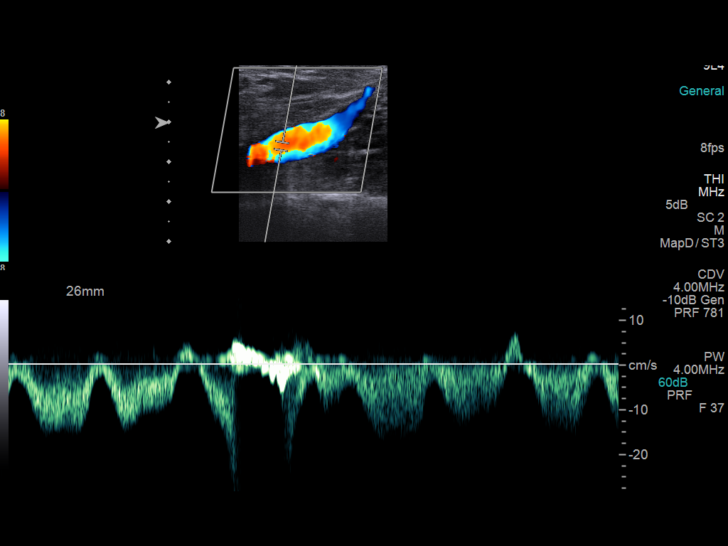
[im 7/40]
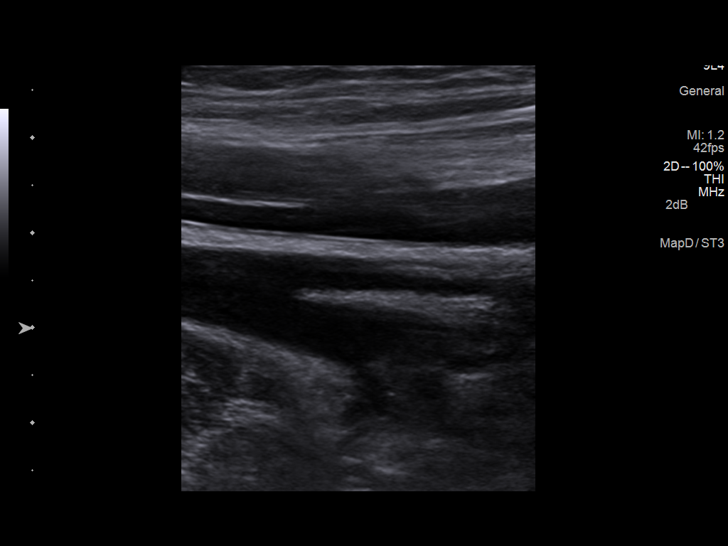
[im 11/40]
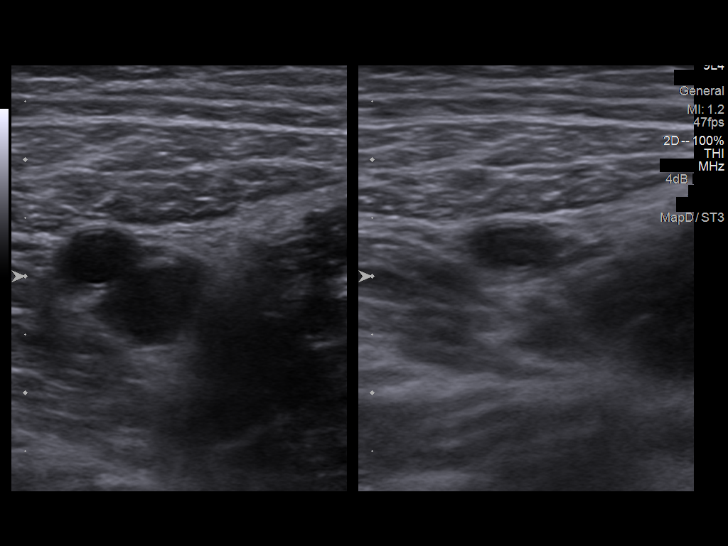
[im 12/40]
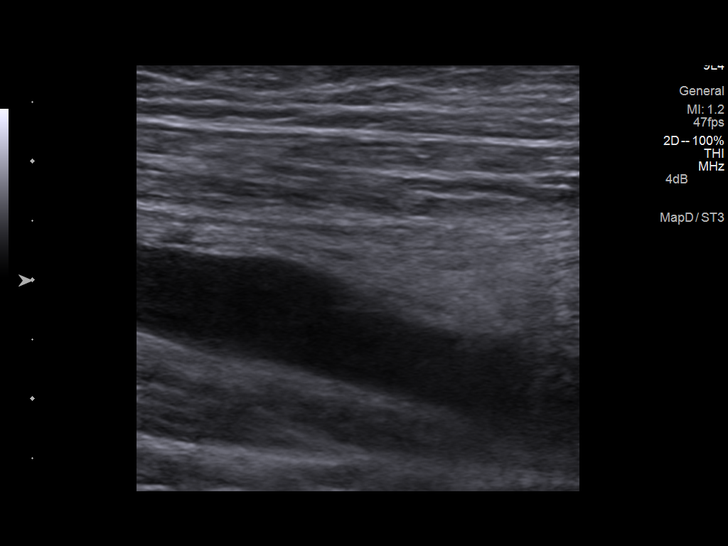
[im 16/40]
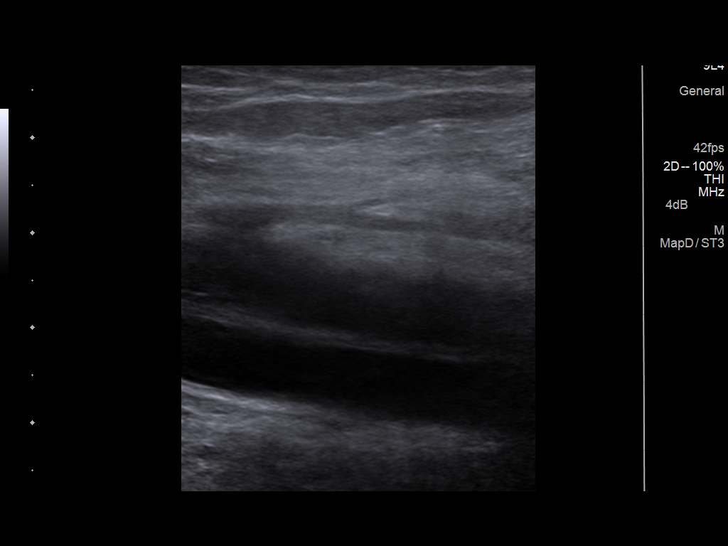
[im 19/40]
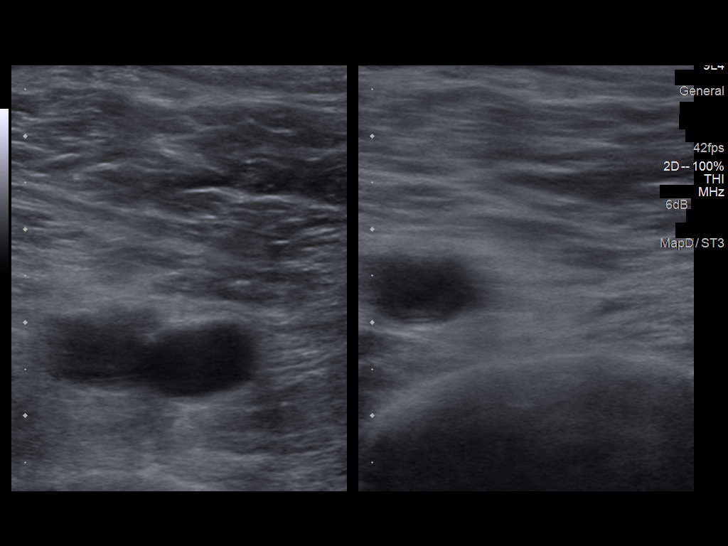
[im 21/40]
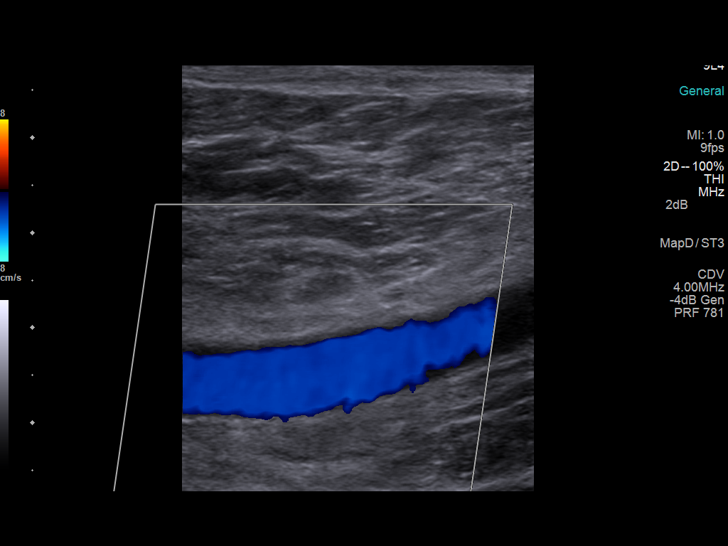
[im 24/40]
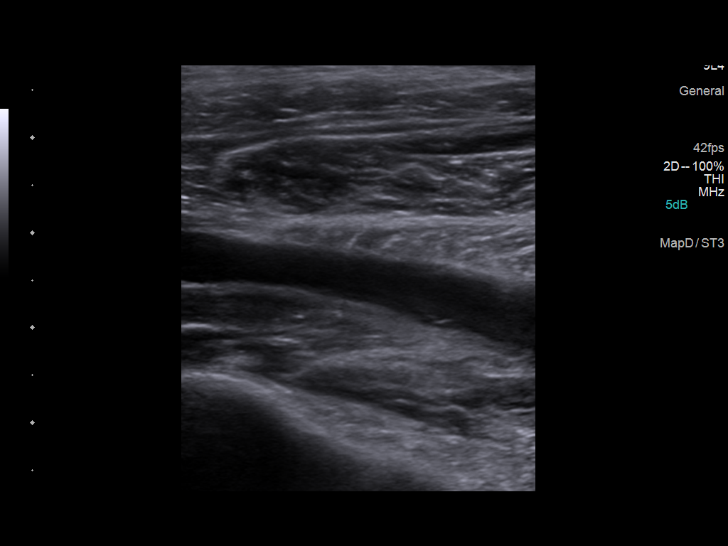
[im 28/40]
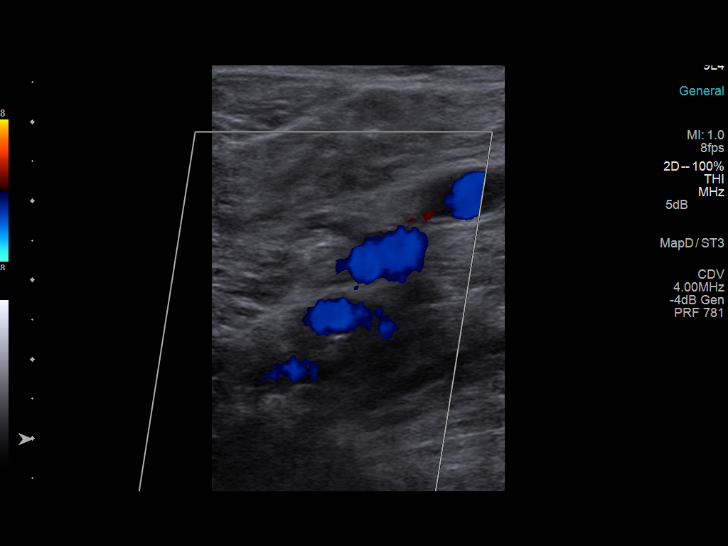
[im 31/40]
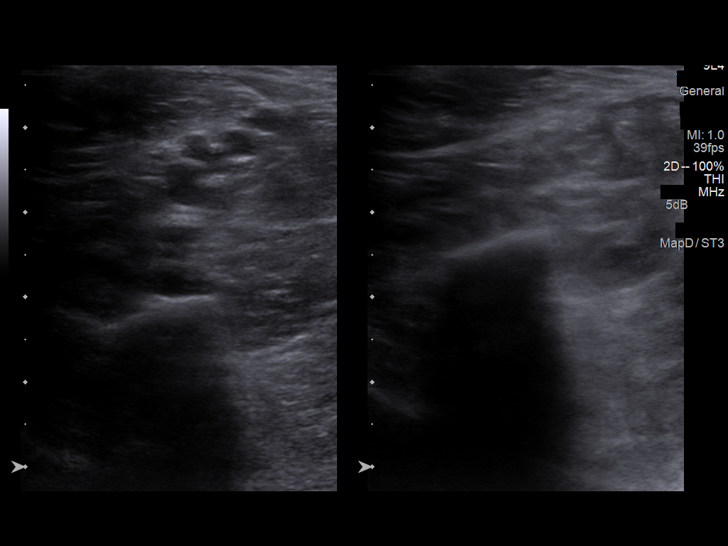
[im 33/40]
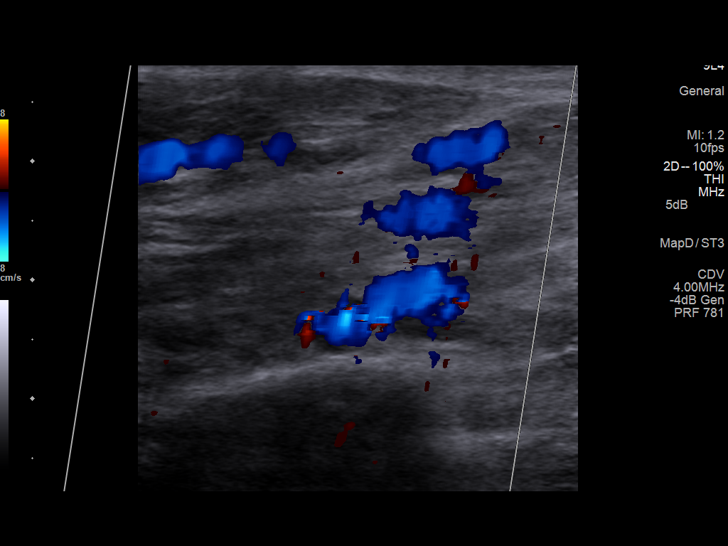
[im 36/40]
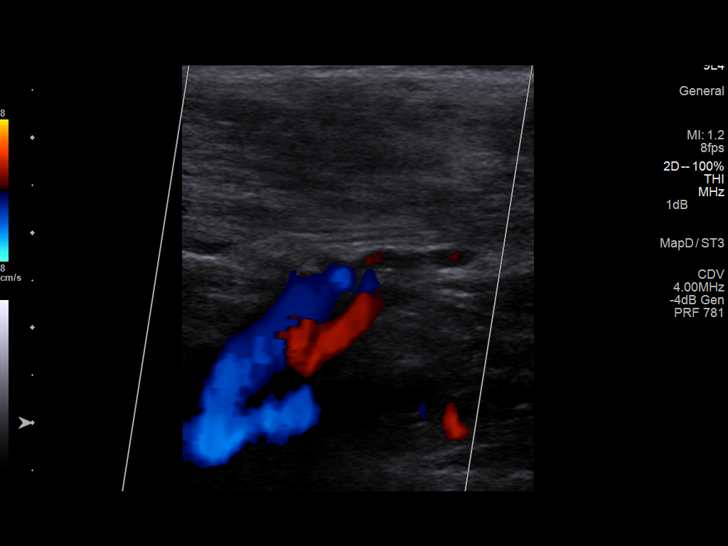
[im 40/40]
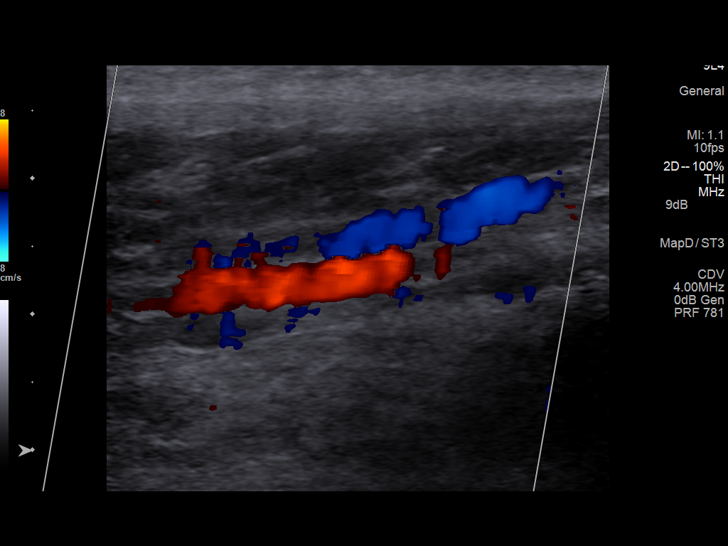

[14 of 24 positions shown; findings below may reference images not displayed]

FINDINGS: Thrombus within deep veins:  None visualized.

Compressibility of deep veins:  Normal.

Duplex waveform respiratory phasicity:  Normal.

Duplex waveform response to augmentation:  Normal.

Venous reflux:  None visualized.

Other findings: Superficial great saphenous vein is patent and
compressible.
IMPRESSION: No evidence of acute deep venous thrombosis.

## 2020-01-24 DEATH — deceased
